# Patient Record
Sex: Male | Born: 1960 | Race: White | Hispanic: No | Marital: Married | Smoking: Never smoker
Health system: Southern US, Community
[De-identification: ages and names within clinical notes are randomized; demographics above are authoritative.]

---

## 2014-04-08 ENCOUNTER — Emergency Department (HOSPITAL_BASED_OUTPATIENT_CLINIC_OR_DEPARTMENT_OTHER)
Admission: EM | Admit: 2014-04-08 | Discharge: 2014-04-08 | Disposition: A | Discharge status: Home / Self Care | Payer: Worker's Compensation | Attending: Emergency Medicine | Admitting: Emergency Medicine

## 2014-04-08 ENCOUNTER — Encounter (HOSPITAL_BASED_OUTPATIENT_CLINIC_OR_DEPARTMENT_OTHER): Payer: Self-pay | Admitting: Emergency Medicine

## 2014-04-08 ENCOUNTER — Emergency Department (HOSPITAL_BASED_OUTPATIENT_CLINIC_OR_DEPARTMENT_OTHER): Payer: Worker's Compensation

## 2014-04-08 DIAGNOSIS — Y9389 Activity, other specified: Secondary | ICD-10-CM | POA: Insufficient documentation

## 2014-04-08 DIAGNOSIS — W230XXA Caught, crushed, jammed, or pinched between moving objects, initial encounter: Secondary | ICD-10-CM | POA: Diagnosis not present

## 2014-04-08 DIAGNOSIS — Z79899 Other long term (current) drug therapy: Secondary | ICD-10-CM | POA: Diagnosis not present

## 2014-04-08 DIAGNOSIS — Z23 Encounter for immunization: Secondary | ICD-10-CM | POA: Diagnosis not present

## 2014-04-08 DIAGNOSIS — Y929 Unspecified place or not applicable: Secondary | ICD-10-CM | POA: Insufficient documentation

## 2014-04-08 DIAGNOSIS — S6990XA Unspecified injury of unspecified wrist, hand and finger(s), initial encounter: Secondary | ICD-10-CM | POA: Insufficient documentation

## 2014-04-08 DIAGNOSIS — S6991XA Unspecified injury of right wrist, hand and finger(s), initial encounter: Secondary | ICD-10-CM

## 2014-04-08 MED ORDER — IBUPROFEN 800 MG PO TABS: 800.0000 mg | ORAL_TABLET | Freq: Three times a day (TID) | ORAL | Status: AC

## 2014-04-08 MED ORDER — IBUPROFEN 800 MG PO TABS
800.0000 mg | ORAL_TABLET | Freq: Once | ORAL | Status: AC
  Administered 2014-04-08: 800 mg via ORAL
  Filled 2014-04-08: qty 1

## 2014-04-08 MED ORDER — TETANUS-DIPHTH-ACELL PERTUSSIS 5-2.5-18.5 LF-MCG/0.5 IM SUSP
0.5000 mL | Freq: Once | INTRAMUSCULAR | Status: AC
  Administered 2014-04-08: 0.5 mL via INTRAMUSCULAR
  Filled 2014-04-08: qty 0.5

## 2014-04-08 NOTE — ED Notes (Signed)
Right hand injury on a belt at work just [facility]blisters noted to palm of hand

## 2014-04-08 NOTE — ED Provider Notes (Signed)
CSN: [id number]     Arrival date & time 04/08/14  1817 History  This chart was scribed for No att. providers found by Carl Bestelina Holson, ED Scribe. This patient was seen in room MH06/MH06 and the patient's care was started at 7:07 PM.    Chief Complaint  Patient presents with  . Hand Injury    Patient is a 53 y.o. male presenting with hand injury. The history is provided by the patient. No language interpreter was used.  Hand Injury  HPI Comments: Collier Bullockhetdara Foss is a 53 y.o. male who presents to the Emergency Department complaining of constant left arm pain he describes as burning that started an hour ago today after he got his right hand stuck in a conveyer belt.  He was unable to get his hand out on his own when it got stuck.  He denies having any other medical problems or taking medications regularly.  He does not have a history of DM.  His last TDAP was more than 5 years ago.     History reviewed. No pertinent past medical history. History reviewed. No pertinent past surgical history. No family history on file. History  Substance Use Topics  . Smoking status: Never Smoker   . Smokeless tobacco: Not on file  . Alcohol Use: No    Review of Systems A complete 10 system review of systems was obtained and all systems are negative except as noted in the HPI and PMH.     Allergies  Review of patient's allergies indicates no known allergies.  Home Medications   Prior to Admission medications   Medication Sig Start Date End Date Taking? Authorizing Provider  ibuprofen (ADVIL,MOTRIN) 800 MG tablet Take 1 tablet (800 mg total) by mouth 3 (three) times daily. 04/08/14   Glynn OctaveStephen Kyliyah Stirn, MD   Triage Vitals: BP 143/92  Pulse 89  Temp(Src) 98.3 F (36.8 C) (Oral)  Resp 18  Ht 5\' 6"  (1.676 m)  Wt 125 lb (56.7 kg)  BMI 20.19 kg/m2  SpO2 100%  Physical Exam  Nursing note and vitals reviewed. Constitutional: He is oriented to person, place, and time. He appears well-developed and  well-nourished. No distress.  HENT:  Head: Normocephalic and atraumatic.  Mouth/Throat: Oropharynx is clear and moist. No oropharyngeal exudate.  Eyes: Conjunctivae and EOM are normal. Pupils are equal, round, and reactive to light.  Neck: Normal range of motion. Neck supple.  No meningismus.  Cardiovascular: Normal rate, regular rhythm, normal heart sounds and intact distal pulses.   No murmur heard. Pulmonary/Chest: Effort normal and breath sounds normal. No respiratory distress.  Abdominal: Soft. There is no tenderness. There is no rebound and no guarding.  Musculoskeletal: Normal range of motion. He exhibits no edema and no tenderness.  Right arm: capillary refill less than 3 seconds.  Compartments are soft.  Intact radial pulse.  Small laceration at the base of the second nail fold.  Blistering lesions to the medial side of the right palm and proximal phalanx of the third finger.  Full range of motion of all finger joints.    Neurological: He is alert and oriented to person, place, and time. No cranial nerve deficit. He exhibits normal muscle tone. Coordination normal.  No ataxia on finger to nose bilaterally. No pronator drift. 5/5 strength throughout. CN 2-12 intact. Negative Romberg. Equal grip strength. Sensation intact. Gait is normal.   Skin: Skin is warm.  Psychiatric: He has a normal mood and affect. His behavior is normal.  ED Course  Procedures (including critical care time)  DIAGNOSTIC STUDIES: Oxygen Saturation is 100% on room air, normal by my interpretation.    COORDINATION OF CARE: 7:08 PM- Discussed administering a TDAP and obtaining an x-ray of his right hand.  The patient agreed to the treatment plan.     Labs Review Labs Reviewed - No data to display  Imaging Review Dg Hand Complete Right  04/08/2014   CLINICAL DATA:  Right hand trauma  EXAM: RIGHT HAND - COMPLETE 3+ VIEW  COMPARISON:  None.  FINDINGS: There is no evidence of fracture or dislocation. There  is no evidence of arthropathy or other focal bone abnormality. Soft tissues are unremarkable.  IMPRESSION: Negative.   Electronically Signed   By: Christiana Pellant M.D.   On: 04/08/2014 19:55     EKG Interpretation None      MDM   Final diagnoses:  Hand injury, right, initial encounter   Patient injured right hand and conveyor belt at work. He has a small laceration to the nail fold of the second digit. There are blistering the medial side of left palm and proximal phalanx of the third digit on the palmar side  X-ray negative. Tetanus updated.  Compartments remained soft. Full range of motion of fingers and hand and wrist. Stable for outpatient followup. Return precautions discussed.  I personally performed the services described in this documentation, which was scribed in my presence. The recorded information has been reviewed and is accurate.    Glynn Octave, MD 04/09/14 [phone]

## 2014-04-08 NOTE — Discharge Instructions (Signed)
Hand Contusion Follow up with Dr. Mina MarbleWeingold. Return to the ED if you develop new or worsening symptoms. A hand contusion is a deep bruise on your hand area. Contusions are the result of an injury that caused bleeding under the skin. The contusion may turn blue, purple, or yellow. Minor injuries will give you a painless contusion, but more severe contusions may stay painful and swollen for a few weeks. CAUSES  A contusion is usually caused by a blow, trauma, or direct force to an area of the body. SYMPTOMS   Swelling and redness of the injured area.  Discoloration of the injured area.  Tenderness and soreness of the injured area.  Pain. DIAGNOSIS  The diagnosis can be made by taking a history and performing a physical exam. An X-ray, CT scan, or MRI may be needed to determine if there were any associated injuries, such as broken bones (fractures). TREATMENT  Often, the best treatment for a hand contusion is resting, elevating, icing, and applying cold compresses to the injured area. Over-the-counter medicines may also be recommended for pain control. HOME CARE INSTRUCTIONS   Put ice on the injured area.  Put ice in a plastic bag.  Place a towel between your skin and the bag.  Leave the ice on for 15-20 minutes, 03-04 times a day.  Only take over-the-counter or prescription medicines as directed by your caregiver. Your caregiver may recommend avoiding anti-inflammatory medicines (aspirin, ibuprofen, and naproxen) for 48 hours because these medicines may increase bruising.  If told, use an elastic wrap as directed. This can help reduce swelling. You may remove the wrap for sleeping, showering, and bathing. If your fingers become numb, cold, or blue, take the wrap off and reapply it more loosely.  Elevate your hand with pillows to reduce swelling.  Avoid overusing your hand if it is painful. SEEK IMMEDIATE MEDICAL CARE IF:   You have increased redness, swelling, or pain in your  hand.  Your swelling or pain is not relieved with medicines.  You have loss of feeling in your hand or are unable to move your fingers.  Your hand turns cold or blue.  You have pain when you move your fingers.  Your hand becomes warm to the touch.  Your contusion does not improve in 2 days. MAKE SURE YOU:   Understand these instructions.  Will watch your condition.  Will get help right away if you are not doing well or get worse. Document Released: 02/10/2002 Document Revised: 05/15/2012 Document Reviewed: 02/12/2012 [facility] Patient Information 2015 [place], [place]. This information is not intended to replace advice given to you by your health care provider. Make sure you discuss any questions you have with your health care provider.

## 2015-09-29 IMAGING — CR DG HAND COMPLETE 3+V*R*
3 series · 3 of 3 positions shown · non-contrast
Comparison: None.

CLINICAL DATA: Right hand trauma

EXAM:
RIGHT HAND - COMPLETE 3+ VIEW

[x hand pa right]
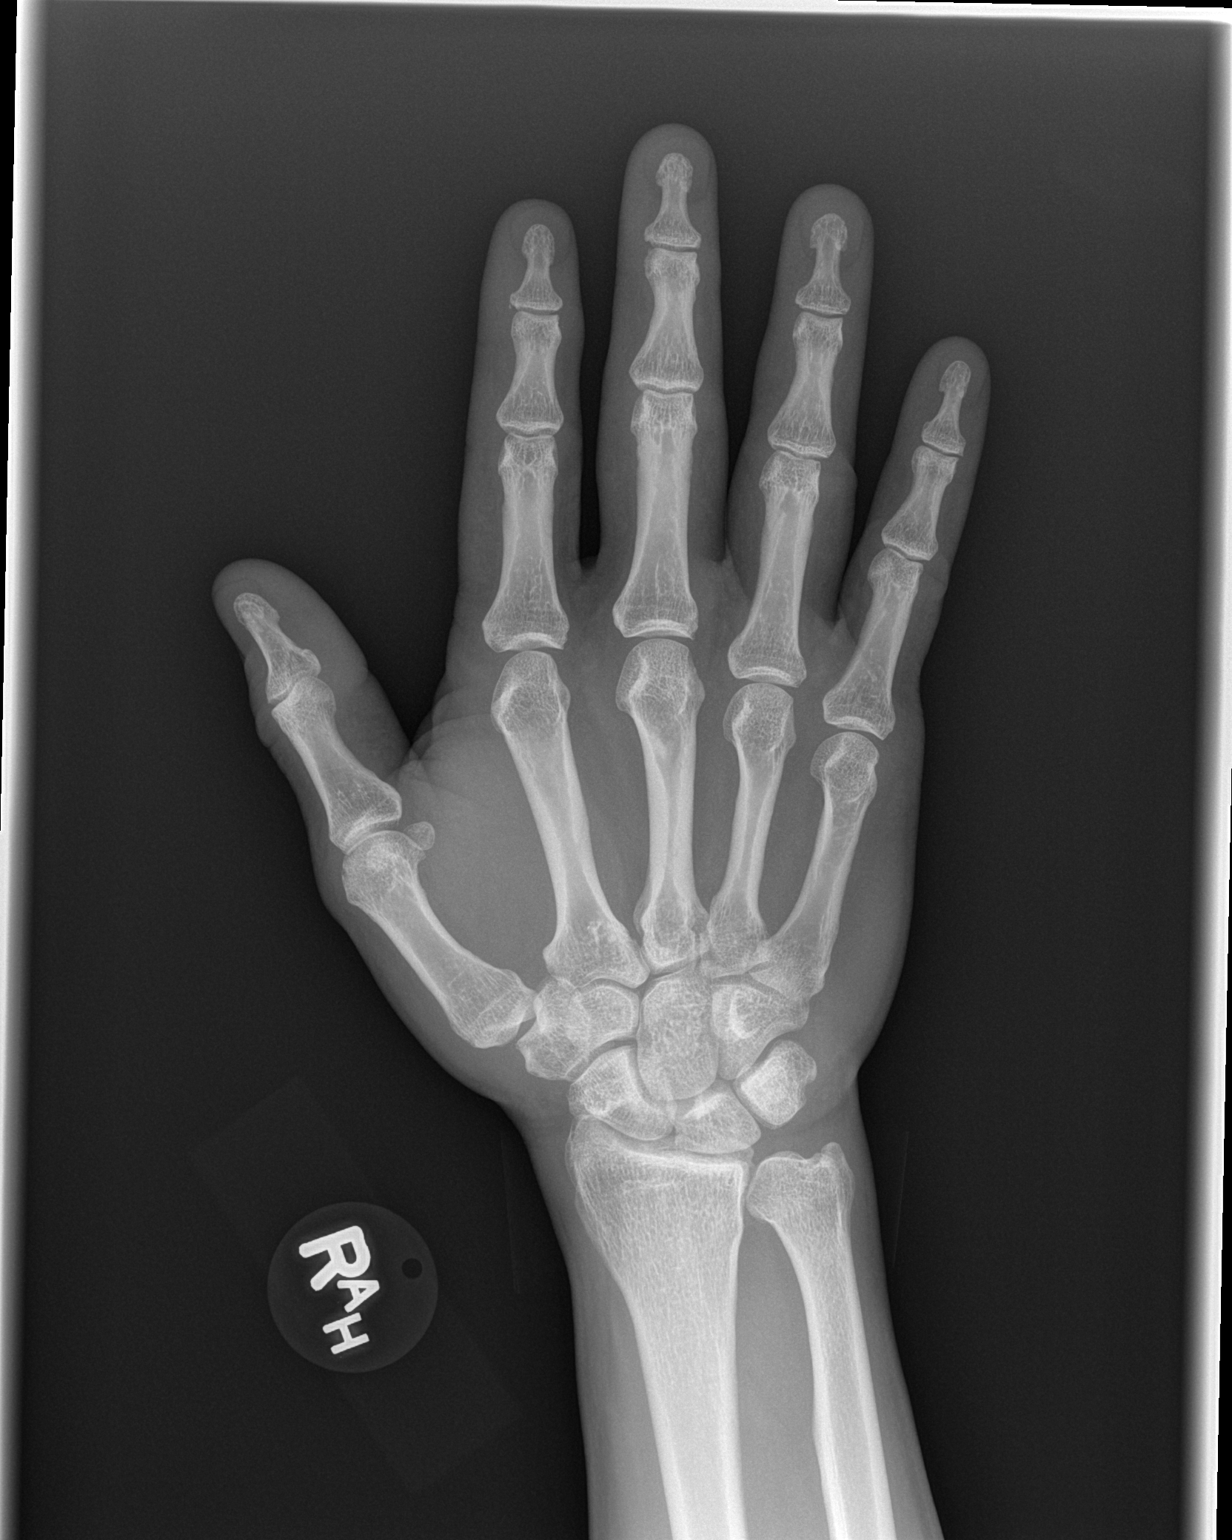

[x hand oblique right]
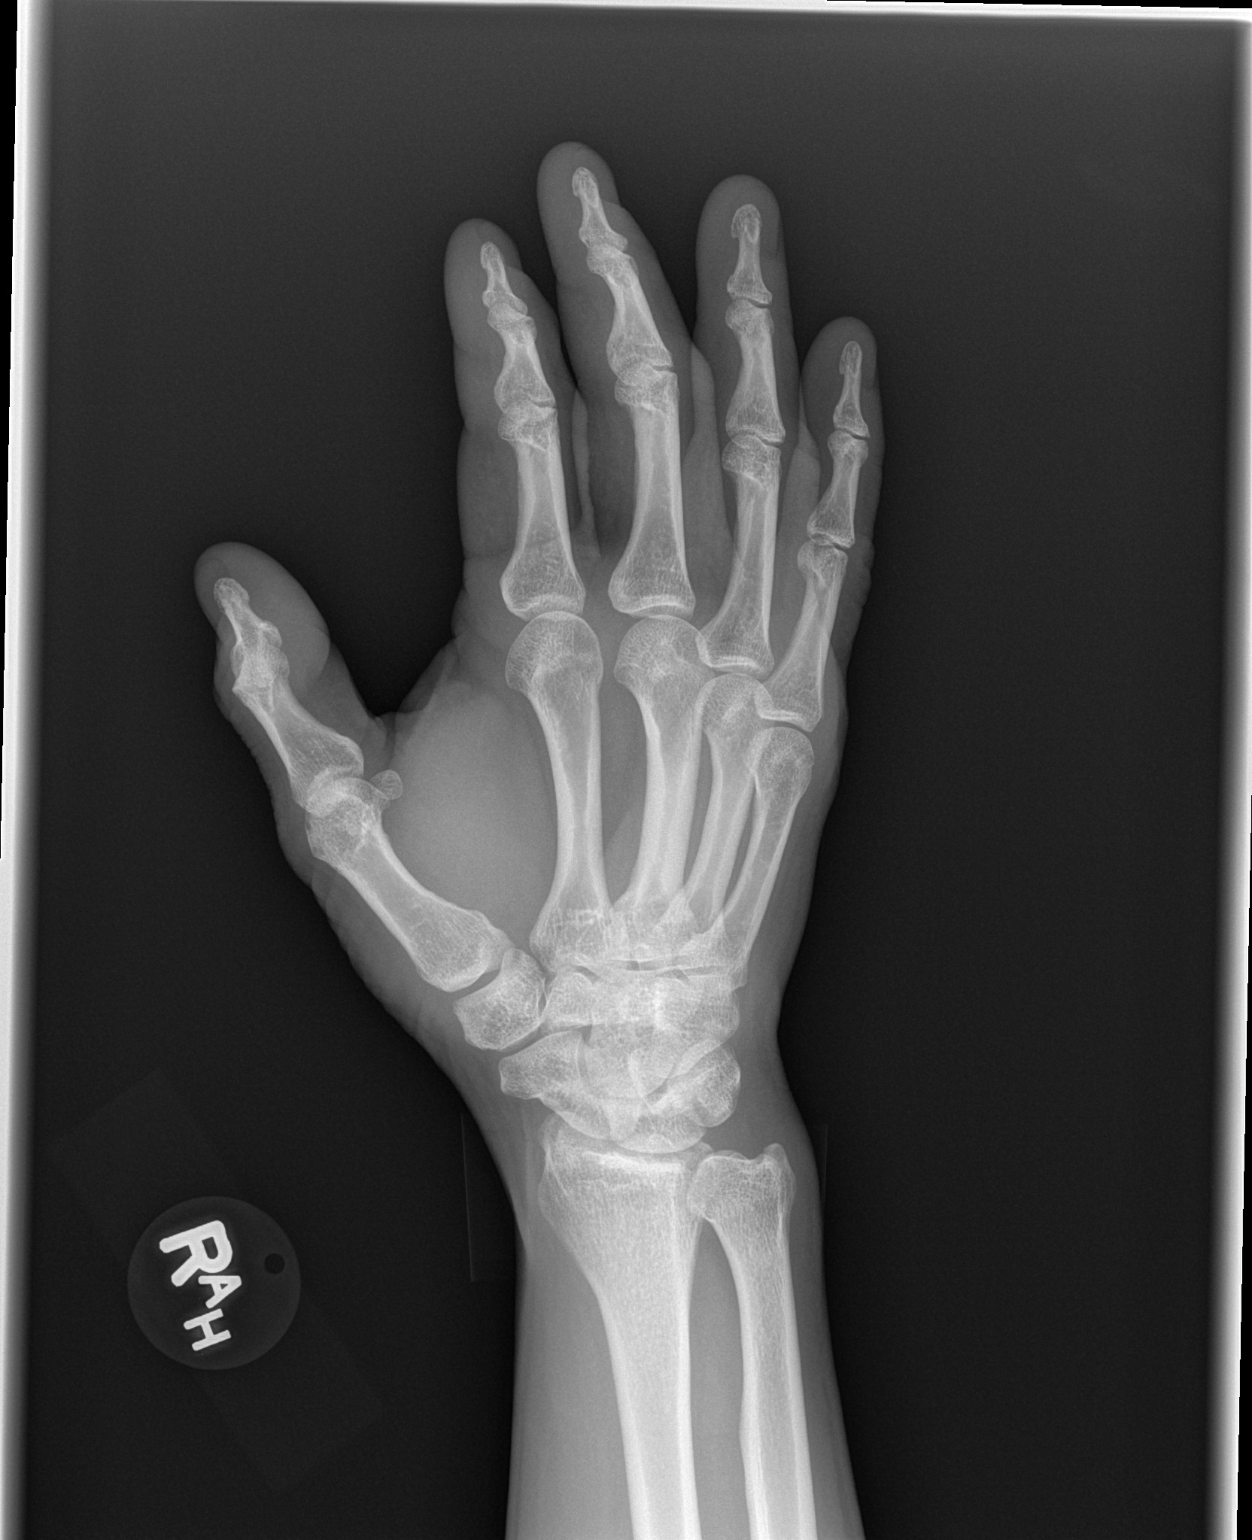

[x hand lat right]
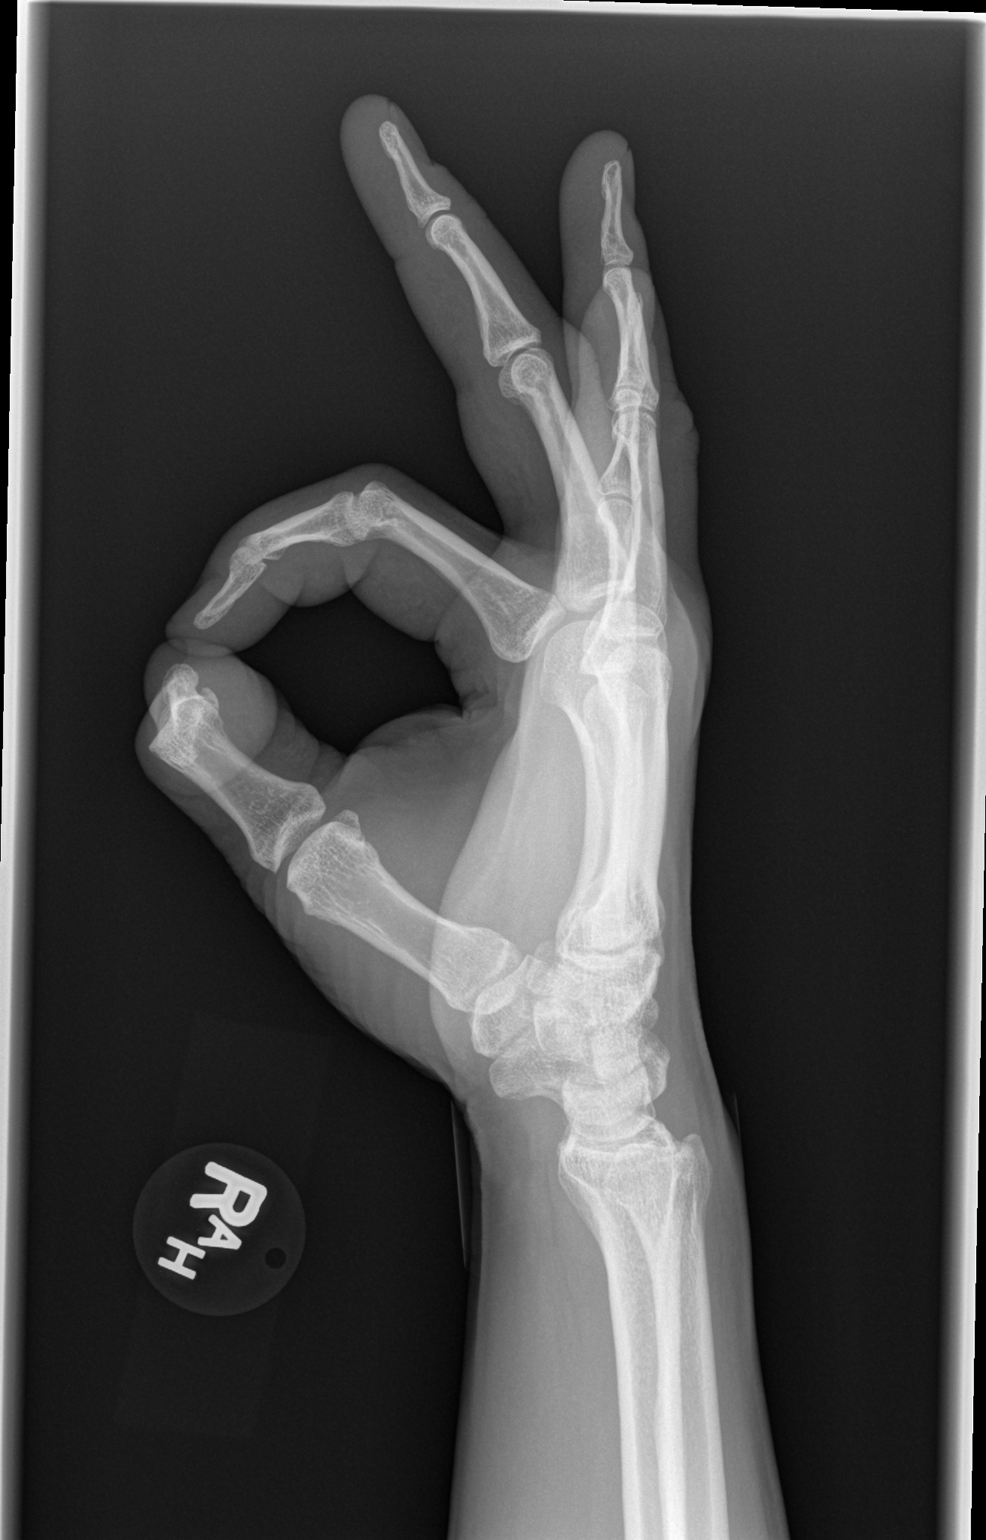

[3 of 3 positions shown; findings below may reference images not displayed]

FINDINGS: There is no evidence of fracture or dislocation. There is no
evidence of arthropathy or other focal bone abnormality. Soft
tissues are unremarkable.
IMPRESSION: Negative.
